# Patient Record
Sex: Female | Born: 1960 | Race: White | Hispanic: No | Marital: Married | Smoking: Never smoker
Health system: Southern US, Community
[De-identification: ages and names within clinical notes are randomized; demographics above are authoritative.]

## PROBLEM LIST (undated history)

## (undated) DIAGNOSIS — N200 Calculus of kidney: Secondary | ICD-10-CM

## (undated) HISTORY — PX: OTHER SURGICAL HISTORY: SHX169

---

## 1999-05-12 ENCOUNTER — Inpatient Hospital Stay (HOSPITAL_COMMUNITY): Admission: AD | Admit: 1999-05-12 | Discharge: 1999-05-12 | Payer: Self-pay | Admitting: *Deleted

## 1999-05-26 ENCOUNTER — Ambulatory Visit (HOSPITAL_COMMUNITY): Admission: EM | Admit: 1999-05-26 | Discharge: 1999-05-26 | Payer: Self-pay | Admitting: Emergency Medicine

## 1999-05-26 ENCOUNTER — Encounter: Payer: Self-pay | Admitting: Urology

## 2011-06-10 ENCOUNTER — Other Ambulatory Visit (HOSPITAL_COMMUNITY): Payer: Self-pay | Admitting: Internal Medicine

## 2011-06-10 DIAGNOSIS — R9431 Abnormal electrocardiogram [ECG] [EKG]: Secondary | ICD-10-CM

## 2011-06-11 ENCOUNTER — Ambulatory Visit (HOSPITAL_COMMUNITY): Payer: PRIVATE HEALTH INSURANCE | Attending: Internal Medicine | Admitting: Radiology

## 2011-06-11 DIAGNOSIS — I079 Rheumatic tricuspid valve disease, unspecified: Secondary | ICD-10-CM | POA: Insufficient documentation

## 2011-06-11 DIAGNOSIS — R9431 Abnormal electrocardiogram [ECG] [EKG]: Secondary | ICD-10-CM | POA: Insufficient documentation

## 2012-09-14 ENCOUNTER — Emergency Department (HOSPITAL_COMMUNITY)
Admission: EM | Admit: 2012-09-14 | Discharge: 2012-09-15 | Disposition: A | Discharge status: Home / Self Care | Payer: PRIVATE HEALTH INSURANCE | Attending: Emergency Medicine | Admitting: Emergency Medicine

## 2012-09-14 ENCOUNTER — Encounter (HOSPITAL_COMMUNITY): Payer: Self-pay | Admitting: Emergency Medicine

## 2012-09-14 DIAGNOSIS — N200 Calculus of kidney: Secondary | ICD-10-CM

## 2012-09-14 DIAGNOSIS — R109 Unspecified abdominal pain: Secondary | ICD-10-CM | POA: Insufficient documentation

## 2012-09-14 DIAGNOSIS — N201 Calculus of ureter: Secondary | ICD-10-CM | POA: Insufficient documentation

## 2012-09-14 HISTORY — DX: Calculus of kidney: N20.0

## 2012-09-14 MED ORDER — FENTANYL CITRATE 0.05 MG/ML IJ SOLN
50.0000 ug | Freq: Once | INTRAMUSCULAR | Status: AC
  Administered 2012-09-15: 50 ug via INTRAVENOUS
  Filled 2012-09-14: qty 2

## 2012-09-14 NOTE — ED Notes (Signed)
Pt c/o left flank pain that radiates to groin. Pain rating 9/10. Hx of kidney stones.Pt c/o N/V

## 2012-09-14 NOTE — ED Notes (Signed)
Pt went to br, bm with approx 2 in stemlike/stick noted in with BM

## 2012-09-15 ENCOUNTER — Emergency Department (HOSPITAL_COMMUNITY): Payer: PRIVATE HEALTH INSURANCE

## 2012-09-15 LAB — COMPREHENSIVE METABOLIC PANEL
ALT: 42 U/L — ABNORMAL HIGH (ref 0–35)
AST: 38 U/L — ABNORMAL HIGH (ref 0–37)
Alkaline Phosphatase: 61 U/L (ref 39–117)
CO2: 27 mEq/L (ref 19–32)
Calcium: 9.9 mg/dL (ref 8.4–10.5)
GFR calc Af Amer: 63 mL/min — ABNORMAL LOW (ref >90)
GFR calc non Af Amer: 55 mL/min — ABNORMAL LOW (ref >90)
Glucose, Bld: 114 mg/dL — ABNORMAL HIGH (ref 70–99)
Potassium: 3.5 mEq/L (ref 3.5–5.1)
Sodium: 136 mEq/L (ref 135–145)
Total Protein: 7.8 g/dL (ref 6.0–8.3)

## 2012-09-15 LAB — CBC WITH DIFFERENTIAL/PLATELET
Basophils Absolute: 0 10*3/uL (ref 0.0–0.1)
Lymphocytes Relative: 17 % (ref 12–46)
Lymphs Abs: 2.2 10*3/uL (ref 0.7–4.0)
Neutrophils Relative %: 73 % (ref 43–77)
Platelets: 256 10*3/uL (ref 150–400)
RBC: 4.46 MIL/uL (ref 3.87–5.11)
RDW: 13 % (ref 11.5–15.5)
WBC: 13 10*3/uL — ABNORMAL HIGH (ref 4.0–10.5)

## 2012-09-15 LAB — URINE MICROSCOPIC-ADD ON

## 2012-09-15 LAB — URINALYSIS, ROUTINE W REFLEX MICROSCOPIC
Bilirubin Urine: NEGATIVE
Nitrite: NEGATIVE
Specific Gravity, Urine: 1.025 (ref 1.005–1.030)
pH: 6 (ref 5.0–8.0)

## 2012-09-15 MED ORDER — ONDANSETRON HCL 4 MG/2ML IJ SOLN
INTRAMUSCULAR | Status: AC
  Filled 2012-09-15: qty 2

## 2012-09-15 MED ORDER — ONDANSETRON HCL 4 MG/2ML IJ SOLN
4.0000 mg | Freq: Once | INTRAMUSCULAR | Status: AC
  Administered 2012-09-15: via INTRAVENOUS

## 2012-09-15 MED ORDER — OXYCODONE-ACETAMINOPHEN 5-325 MG PO TABS
1.0000 | ORAL_TABLET | ORAL | Status: AC | PRN
Start: 2012-09-15 — End: ?

## 2012-09-15 MED ORDER — TAMSULOSIN HCL 0.4 MG PO CAPS: 0.4000 mg | ORAL_CAPSULE | Freq: Every day | ORAL | Status: AC

## 2012-09-15 MED ORDER — PROMETHAZINE HCL 25 MG PO TABS: 25.0000 mg | ORAL_TABLET | Freq: Four times a day (QID) | ORAL | Status: AC | PRN

## 2012-09-15 MED ORDER — HYDROMORPHONE HCL PF 1 MG/ML IJ SOLN
1.0000 mg | Freq: Once | INTRAMUSCULAR | Status: AC
  Administered 2012-09-15: 1 mg via INTRAVENOUS
  Filled 2012-09-15: qty 1

## 2012-09-15 NOTE — ED Provider Notes (Signed)
History     CSN: [id number]  Arrival date & time 09/14/12  2254   First MD Initiated Contact with Patient 09/15/12 0029      Chief Complaint  Patient presents with  . Flank Pain    (Consider location/radiation/quality/duration/timing/severity/associated sxs/prior treatment) Patient is a 51 y.o. female presenting with flank pain. The history is provided by the patient.  Flank Pain Pertinent negatives include no chest pain, no abdominal pain, no headaches and no shortness of breath.  L flnak started 8pm, severe, sharp and radiates from back to L flank, h/o kidney stone 10 years ago and feels similar, no F/C, nausea no vomiting. No truama, no hematuria, no dysuria.   Past Medical History  Diagnosis Date  . Kidney stones     Past Surgical History  Procedure Date  . Eptopic     No family history on file.  History  Substance Use Topics  . Smoking status: Never Smoker   . Smokeless tobacco: Not on file  . Alcohol Use: Yes    OB History    Grav Para Term Preterm Abortions TAB SAB Ect Mult Living                  Review of Systems  Constitutional: Negative for fever and chills.  HENT: Negative for neck pain and neck stiffness.   Eyes: Negative for pain.  Respiratory: Negative for shortness of breath.   Cardiovascular: Negative for chest pain.  Gastrointestinal: Negative for abdominal pain.  Genitourinary: Positive for flank pain. Negative for dysuria.  Musculoskeletal: Negative for back pain.  Skin: Negative for rash.  Neurological: Negative for headaches.  All other systems reviewed and are negative.    Allergies  Review of patient's allergies indicates no known allergies.  Home Medications   Current Outpatient Rx  Name Route Sig Dispense Refill  . VITAMIN D 1000 UNITS PO TABS Oral Take 1,000 Units by mouth daily.    Marland Kitchen LEVOTHYROXINE SODIUM 50 MCG PO TABS Oral Take 50 mcg by mouth every morning.    . ADULT MULTIVITAMIN W/MINERALS CH Oral Take 1 tablet by  mouth daily.    . OMEGA-3-ACID ETHYL ESTERS 1 G PO CAPS Oral Take 1 g by mouth daily.    Marland Kitchen ROSUVASTATIN CALCIUM 5 MG PO TABS Oral Take 5 mg by mouth every evening.      BP 96/53  Pulse 59  Temp 97.5 F (36.4 C) (Oral)  Resp 18  Ht 5\' 3"  (1.6 m)  Wt 110 lb (49.896 kg)  BMI 19.49 kg/m2  SpO2 100%  Physical Exam  Constitutional: She is oriented to person, place, and time. She appears well-developed and well-nourished.  HENT:  Head: Normocephalic and atraumatic.  Eyes: Conjunctivae normal and EOM are normal. Pupils are equal, round, and reactive to light.  Neck: Trachea normal. Neck supple. No thyromegaly present.  Cardiovascular: Normal rate, regular rhythm, S1 normal, S2 normal and normal pulses.     No systolic murmur is present   No diastolic murmur is present  Pulses:      Radial pulses are 2+ on the right side, and 2+ on the left side.  Pulmonary/Chest: Effort normal and breath sounds normal. She has no wheezes. She has no rhonchi. She has no rales. She exhibits no tenderness.  Abdominal: Soft. Normal appearance and bowel sounds are normal. There is no rebound, no guarding, no CVA tenderness and negative Murphy's sign.       TTP l flank no peritonitis or ABD  tenderness.   Musculoskeletal:       BLE:s Calves nontender, no cords or erythema, negative Homans sign  Neurological: She is alert and oriented to person, place, and time. She has normal strength. No cranial nerve deficit or sensory deficit. GCS eye subscore is 4. GCS verbal subscore is 5. GCS motor subscore is 6.  Skin: Skin is warm and dry. No rash noted. She is not diaphoretic.  Psychiatric: Her speech is normal.       Cooperative and appropriate    ED Course  Procedures (including critical care time)  IVFs.  IV fentanyl no relief of pain  IV Dilaudid.  On recheck is feeling much better.   Results for orders placed during the hospital encounter of 09/14/12  CBC WITH DIFFERENTIAL      Component Value Range    WBC 13.0 (*) 4.0 - 10.5 K/uL   RBC 4.46  3.87 - 5.11 MIL/uL   Hemoglobin 14.1  12.0 - 15.0 g/dL   HCT 16.1  09.6 - 04.5 %   MCV 90.6  78.0 - 100.0 fL   MCH 31.6  26.0 - 34.0 pg   MCHC 34.9  30.0 - 36.0 g/dL   RDW 40.9  81.1 - 91.4 %   Platelets 256  150 - 400 K/uL   Neutrophils Relative 73  43 - 77 %   Neutro Abs 9.5 (*) 1.7 - 7.7 K/uL   Lymphocytes Relative 17  12 - 46 %   Lymphs Abs 2.2  0.7 - 4.0 K/uL   Monocytes Relative 9  3 - 12 %   Monocytes Absolute 1.2 (*) 0.1 - 1.0 K/uL   Eosinophils Relative 1  0 - 5 %   Eosinophils Absolute 0.1  0.0 - 0.7 K/uL   Basophils Relative 0  0 - 1 %   Basophils Absolute 0.0  0.0 - 0.1 K/uL  COMPREHENSIVE METABOLIC PANEL      Component Value Range   Sodium 136  135 - 145 mEq/L   Potassium 3.5  3.5 - 5.1 mEq/L   Chloride 97  96 - 112 mEq/L   CO2 27  19 - 32 mEq/L   Glucose, Bld 114 (*) 70 - 99 mg/dL   BUN 19  6 - 23 mg/dL   Creatinine, Ser 7.82 (*) 0.50 - 1.10 mg/dL   Calcium 9.9  8.4 - 95.6 mg/dL   Total Protein 7.8  6.0 - 8.3 g/dL   Albumin 4.2  3.5 - 5.2 g/dL   AST 38 (*) 0 - 37 U/L   ALT 42 (*) 0 - 35 U/L   Alkaline Phosphatase 61  39 - 117 U/L   Total Bilirubin 0.3  0.3 - 1.2 mg/dL   GFR calc non Af Amer 55 (*) >90 mL/min   GFR calc Af Amer 63 (*) >90 mL/min  URINALYSIS, ROUTINE W REFLEX MICROSCOPIC      Component Value Range   Color, Urine YELLOW  YELLOW   APPearance CLOUDY (*) CLEAR   Specific Gravity, Urine 1.025  1.005 - 1.030   pH 6.0  5.0 - 8.0   Glucose, UA NEGATIVE  NEGATIVE mg/dL   Hgb urine dipstick SMALL (*) NEGATIVE   Bilirubin Urine NEGATIVE  NEGATIVE   Ketones, ur TRACE (*) NEGATIVE mg/dL   Protein, ur NEGATIVE  NEGATIVE mg/dL   Urobilinogen, UA 1.0  0.0 - 1.0 mg/dL   Nitrite NEGATIVE  NEGATIVE   Leukocytes, UA MODERATE (*) NEGATIVE  URINE MICROSCOPIC-ADD ON  Component Value Range   Squamous Epithelial / LPF FEW (*) RARE   WBC, UA 3-6  <3 WBC/hpf   RBC / HPF 3-6  <3 RBC/hpf   Bacteria, UA RARE  RARE    Crystals CA OXALATE CRYSTALS (*) NEGATIVE   Ct Abdomen Pelvis Wo Contrast  09/15/2012  *RADIOLOGY REPORT*  Clinical Data: Left flank pain.  Pain radiating to the groin.  CT ABDOMEN AND PELVIS WITHOUT CONTRAST  Technique:  Multidetector CT imaging of the abdomen and pelvis was performed following the standard protocol without intravenous contrast.  Comparison: None.  Findings: Lung Bases: Mild atelectasis.  Liver:  Unenhanced CT was performed per clinician order.  Lack of IV contrast limits sensitivity and specificity, especially for evaluation of abdominal/pelvic solid viscera.  Focal fatty infiltration adjacent to the falciform ligament of the liver. Otherwise grossly normal.  Spleen:  Normal.  Gallbladder:  Normal.  Common bile duct:  Normal.  Pancreas:  Normal.  Adrenal glands:  Normal.  Kidneys:  Moderate left hydronephrosis.  Left UPJ stone is present measuring 4 mm x 2 mm.  No residual renal calculi.  Distal left ureter appears normal.  Right kidney and ureter normal.  Stomach:  Grossly normal.  Small bowel:  Normal.  Colon:   Normal retrocecal appendix.  Prominent stool burden. Distal colon decompressed.  Pelvic Genitourinary:  Physiologic appearance of the uterus and adnexa.  Urinary bladder decompressed.  Bones:  Normal.  Vasculature: Normal.  IMPRESSION: Moderate left hydronephrosis with 4 mm x 3 mm left UPJ stone.  No residual collecting system calculi.   Original Report Authenticated By: Andreas Newport, M.D.    1:24 AM feels comfortable for d/c home. Kidney stone precautions verbalized as understood. RX percocet, zofran and flomax. Urology referral provided.    MDM   Ureterolithiasis L sided with moderate hydro by CT scan reviewed. Pain controlled IV narcotics. IVFs provided. UA as above - no UTI symptoms. Labs reviewed as above crt 1.14        Sunnie Nielsen, MD 09/15/12 0127

## 2012-11-25 ENCOUNTER — Encounter (INDEPENDENT_AMBULATORY_CARE_PROVIDER_SITE_OTHER): Payer: Self-pay | Admitting: Surgery

## 2012-11-25 ENCOUNTER — Ambulatory Visit (INDEPENDENT_AMBULATORY_CARE_PROVIDER_SITE_OTHER): Payer: PRIVATE HEALTH INSURANCE | Admitting: Surgery

## 2012-11-25 VITALS — BP 102/68 | HR 71 | Temp 98.6°F | Resp 18 | Ht 63.0 in | Wt 105.4 lb

## 2012-11-25 DIAGNOSIS — K649 Unspecified hemorrhoids: Secondary | ICD-10-CM | POA: Insufficient documentation

## 2012-11-25 NOTE — Progress Notes (Signed)
[facility]  Candice Kin, MD,  FACS [address] [place]  [address] [place], [place]    [address] Phone:  [phone] FAX:  [phone]   Re:   Candice Reyes DOB:   12/06/1961 MRN:   [id number]  Urgent Clinic  ASSESSMENT AND PLAN: 1.  Hemorrhoids  External, but these are not the source of her pain  1a.  Posterior anal fissure  I gave her information on rectal disease and rectal fissures.  I gave her a prescription for diltiazam.  I gave her literature on rectal disease.  Return appt in 4 weeks.  2.  History of left nephrolithiasis   Sees Dr. Retta Diones.  Seen in ER - 09/14/2012  3.  Hypothyroid x 10 years - corrected 4.  Hypercholesterolemia x 4 years  HISTORY OF PRESENT ILLNESS: Chief Complaint  Patient presents with  . Rectal Pain    Candice Reyes is a 51 y.o. (DOB: 06-01-1961)  white female who is a patient of PERINI,MARK A, MD and comes to me today for rectal pain and hemorrhoids.  She had a colonoscopy about one year ago by Dr. Evette Cristal.  She has no history of stomach, liver, pancreas, or colon disease.  She has hemorrhoids that have flared up in the past.  This episode started in the October and had gotten slightly worse since then.  She has pain pretty much all the time, a little worse with bowel movements.  She had some "loose stools" at the beginning of the pain, but her bowel habits have been very regular since then.  Past Medical History  Diagnosis Date  . Kidney stones    Current Outpatient Prescriptions  Medication Sig Dispense Refill  . cholecalciferol (VITAMIN D) 1000 UNITS tablet Take 1,000 Units by mouth daily.      Marland Kitchen levothyroxine (SYNTHROID, LEVOTHROID) 50 MCG tablet Take 50 mcg by mouth every morning.      . Multiple Vitamin (MULTIVITAMIN WITH MINERALS) TABS Take 1 tablet by mouth daily.      Marland Kitchen omega-3 acid ethyl esters (LOVAZA) 1 G capsule Take 1 g by mouth daily.      Marland Kitchen oxyCODONE-acetaminophen (PERCOCET/ROXICET) 5-325 MG per  tablet Take 1 tablet by mouth every 4 (four) hours as needed for pain.  15 tablet  0  . promethazine (PHENERGAN) 25 MG tablet Take 1 tablet (25 mg total) by mouth every 6 (six) hours as needed for nausea.  30 tablet  0  . rosuvastatin (CRESTOR) 5 MG tablet Take 5 mg by mouth every evening.      . Tamsulosin HCl (FLOMAX) 0.4 MG CAPS Take 1 capsule (0.4 mg total) by mouth daily after breakfast.  30 capsule  0   Social history: She works as a [employer] operational officer. Note:  I operated on her mother, Regan Lemming, for diverticular disease.  She is doing well.  PHYSICAL EXAM: There were no vitals taken for this visit.  Abdomen:  Benign Rectum:  External hemorrhoids, moderate.  Posterior anal pain c/w a fissure, though I could not see it.  She does have moderate anal spasm.  DATA REVIEWED: None new.  Candice Kin, MD, FACS Office:  [phone]

## 2012-12-01 ENCOUNTER — Ambulatory Visit (INDEPENDENT_AMBULATORY_CARE_PROVIDER_SITE_OTHER): Payer: PRIVATE HEALTH INSURANCE | Admitting: General Surgery

## 2012-12-24 ENCOUNTER — Encounter (INDEPENDENT_AMBULATORY_CARE_PROVIDER_SITE_OTHER): Payer: PRIVATE HEALTH INSURANCE | Admitting: Surgery

## 2013-11-28 ENCOUNTER — Other Ambulatory Visit: Payer: Self-pay | Admitting: Internal Medicine

## 2013-11-28 DIAGNOSIS — N200 Calculus of kidney: Secondary | ICD-10-CM

## 2013-11-29 ENCOUNTER — Ambulatory Visit
Admission: RE | Admit: 2013-11-29 | Discharge: 2013-11-29 | Disposition: A | Discharge status: Home / Self Care | Payer: PRIVATE HEALTH INSURANCE | Source: Ambulatory Visit | Attending: Internal Medicine | Admitting: Internal Medicine

## 2013-11-29 ENCOUNTER — Other Ambulatory Visit: Payer: Self-pay | Admitting: Internal Medicine

## 2013-11-29 DIAGNOSIS — R109 Unspecified abdominal pain: Secondary | ICD-10-CM

## 2013-11-29 DIAGNOSIS — N200 Calculus of kidney: Secondary | ICD-10-CM

## 2014-04-11 ENCOUNTER — Other Ambulatory Visit: Payer: Self-pay | Admitting: Internal Medicine

## 2014-04-11 DIAGNOSIS — R109 Unspecified abdominal pain: Secondary | ICD-10-CM

## 2014-04-12 ENCOUNTER — Ambulatory Visit
Admission: RE | Admit: 2014-04-12 | Discharge: 2014-04-12 | Disposition: A | Discharge status: Home / Self Care | Payer: PRIVATE HEALTH INSURANCE | Source: Ambulatory Visit | Attending: Internal Medicine | Admitting: Internal Medicine

## 2014-04-12 ENCOUNTER — Other Ambulatory Visit: Payer: Self-pay | Admitting: Internal Medicine

## 2014-04-12 DIAGNOSIS — R109 Unspecified abdominal pain: Secondary | ICD-10-CM

## 2014-04-12 DIAGNOSIS — N200 Calculus of kidney: Secondary | ICD-10-CM

## 2014-04-12 DIAGNOSIS — R1012 Left upper quadrant pain: Secondary | ICD-10-CM

## 2020-02-27 ENCOUNTER — Ambulatory Visit: Payer: PRIVATE HEALTH INSURANCE | Attending: Internal Medicine

## 2020-02-27 DIAGNOSIS — Z23 Encounter for immunization: Secondary | ICD-10-CM | POA: Insufficient documentation

## 2020-02-27 NOTE — Progress Notes (Signed)
   Covid-19 Vaccination Clinic  Name:  [name]    MRN: [id number] DOB: 04/11/1961  02/27/2020  [name] was observed post Covid-19 immunization for 15 minutes without incident. She was provided with Vaccine Information Sheet and instruction to access the V-Safe system.   [name] was instructed to call 911 with any severe reactions post vaccine: Marland Kitchen Difficulty breathing  . Swelling of face and throat  . A fast heartbeat  . A bad rash all over body  . Dizziness and weakness   Immunizations Administered    Name Date Dose VIS Date Route   Pfizer COVID-19 Vaccine 02/27/2020  2:13 PM 0.3 mL 12/02/2019 Intramuscular   Manufacturer: [employer], [employer]   Lot: [id number]   NDC: [address]-4

## 2020-03-28 ENCOUNTER — Ambulatory Visit: Payer: PRIVATE HEALTH INSURANCE | Attending: Internal Medicine

## 2020-03-28 DIAGNOSIS — Z23 Encounter for immunization: Secondary | ICD-10-CM

## 2020-03-28 NOTE — Progress Notes (Signed)
   Covid-19 Vaccination Clinic  Name:  JACLYNN LAUMANN    MRN: [id number] DOB: 02-04-1961  03/28/2020  Ms. Olmeda was observed post Covid-19 immunization for 15 minutes without incident. She was provided with Vaccine Information Sheet and instruction to access the V-Safe system.   Ms. Costanza was instructed to call 911 with any severe reactions post vaccine: Marland Kitchen Difficulty breathing  . Swelling of face and throat  . A fast heartbeat  . A bad rash all over body  . Dizziness and weakness   Immunizations Administered    Name Date Dose VIS Date Route   Pfizer COVID-19 Vaccine 03/28/2020 12:36 PM 0.3 mL 12/02/2019 Intramuscular   Manufacturer: [employer], [employer]   Lot: [id number]   NDC: [address]-6

## 2020-04-19 ENCOUNTER — Other Ambulatory Visit: Payer: Self-pay | Admitting: Internal Medicine

## 2020-04-19 DIAGNOSIS — R1011 Right upper quadrant pain: Secondary | ICD-10-CM

## 2020-04-27 ENCOUNTER — Ambulatory Visit
Admission: RE | Admit: 2020-04-27 | Discharge: 2020-04-27 | Disposition: A | Discharge status: Home / Self Care | Payer: PRIVATE HEALTH INSURANCE | Source: Ambulatory Visit | Attending: Internal Medicine | Admitting: Internal Medicine

## 2020-04-27 DIAGNOSIS — R1011 Right upper quadrant pain: Secondary | ICD-10-CM

## 2020-11-01 ENCOUNTER — Other Ambulatory Visit: Payer: Self-pay | Admitting: Internal Medicine

## 2020-11-01 DIAGNOSIS — M810 Age-related osteoporosis without current pathological fracture: Secondary | ICD-10-CM

## 2021-02-21 ENCOUNTER — Other Ambulatory Visit: Payer: No Typology Code available for payment source

## 2021-11-08 ENCOUNTER — Other Ambulatory Visit: Payer: Self-pay | Admitting: Internal Medicine

## 2021-11-08 DIAGNOSIS — E785 Hyperlipidemia, unspecified: Secondary | ICD-10-CM

## 2021-12-04 ENCOUNTER — Ambulatory Visit
Admission: RE | Admit: 2021-12-04 | Discharge: 2021-12-04 | Disposition: A | Discharge status: Home / Self Care | Payer: No Typology Code available for payment source | Source: Ambulatory Visit | Attending: Internal Medicine | Admitting: Internal Medicine

## 2021-12-04 DIAGNOSIS — E785 Hyperlipidemia, unspecified: Secondary | ICD-10-CM

## 2022-01-28 IMAGING — CT CT CARDIAC CORONARY ARTERY CALCIUM SCORE
3 series · 14 of 20 positions shown, 16 images · non-contrast
Comparison: None.

CLINICAL DATA: 60-year-old Caucasian female with family history of
heart disease.

EXAM:
CT CARDIAC CORONARY ARTERY CALCIUM SCORE
TECHNIQUE: Non-contrast imaging through the heart was performed using
prospective ECG gating. Image post processing was performed on an
independent workstation, allowing for quantitative analysis of the
heart and coronary arteries. Note that this exam targets the heart
and the chest was not imaged in its entirety.

[Series 2: calcium scoring 2.00 qr36 bestdiast 70% hrt calciu · axial · 0.33mm/px · z∈[+1453,+1549]mm · 4 of 80 slices shown]
[im 16/80  vessel]
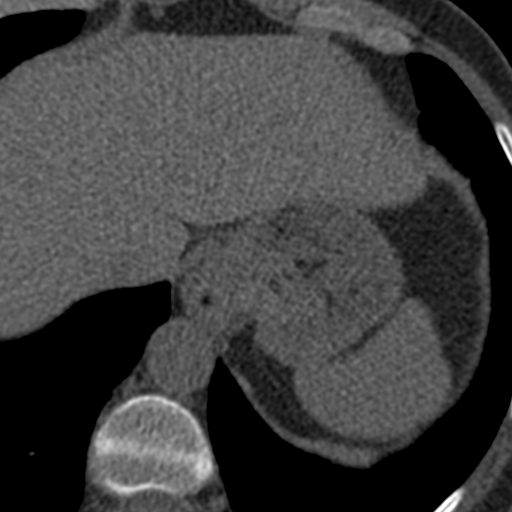
[im 32/80  vessel]
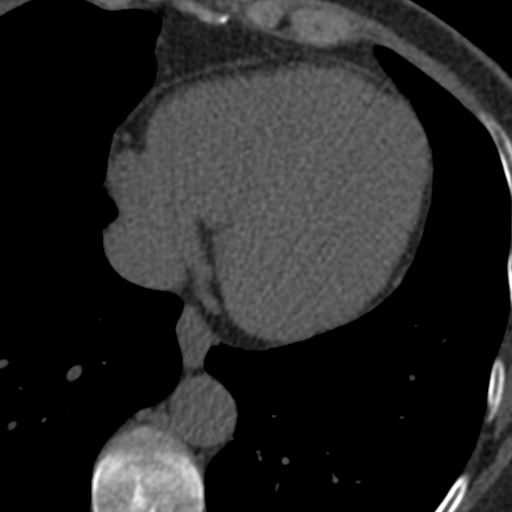
[im 48/80  vessel]
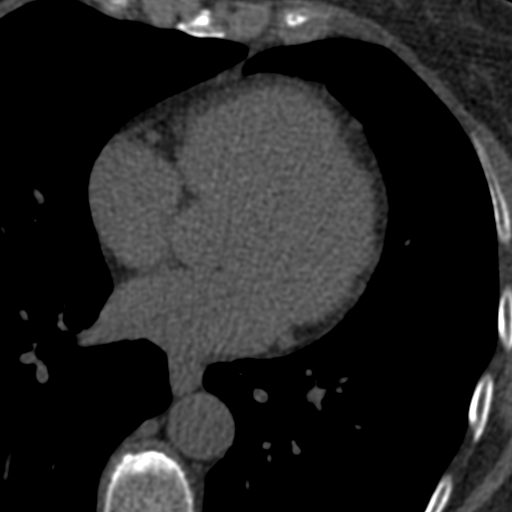
[im 64/80  vessel]
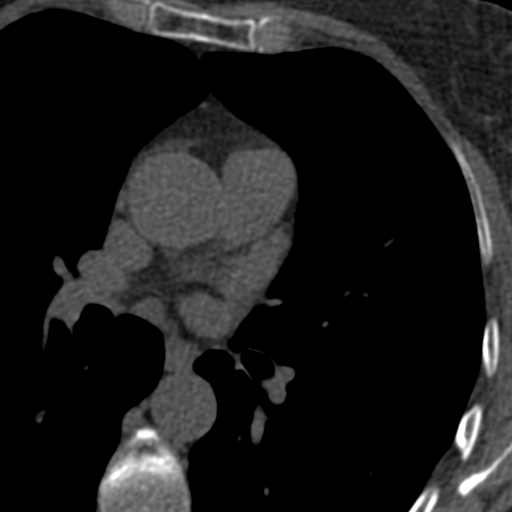

[Series 3: calcium scoring 2.00 br40 bestdiast 70% axial · axial · 0.56mm/px · z∈[+1449,+1553]mm · 5 of 80 slices shown, 7 images]
[im 14/80  vessel]
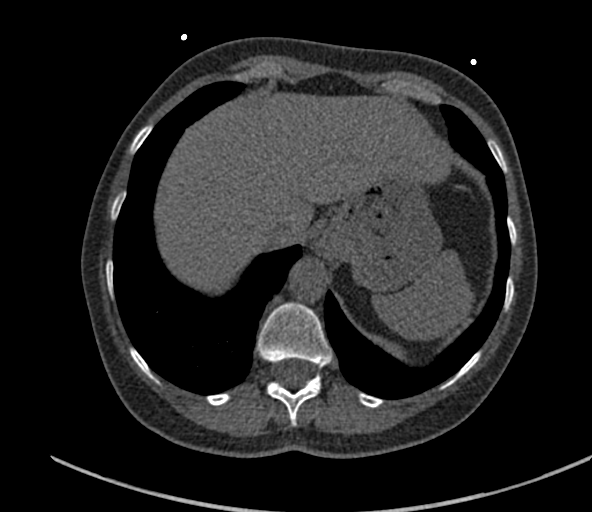
[im 14/80  lung]
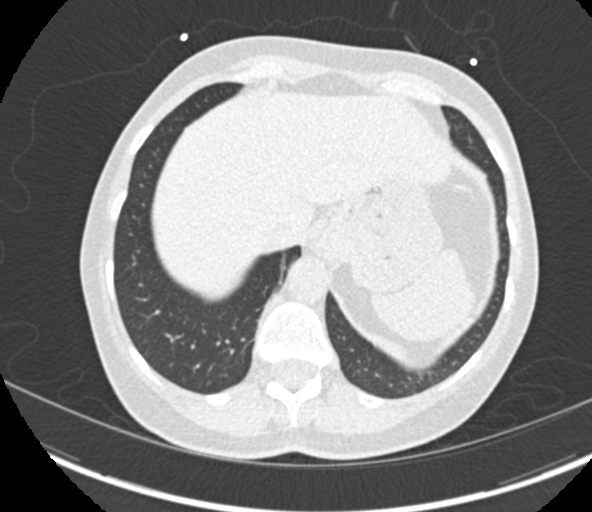
[im 27/80  vessel]
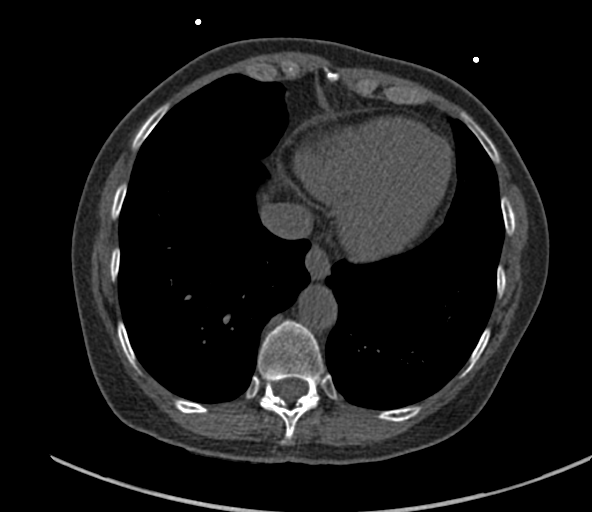
[im 40/80  vessel]
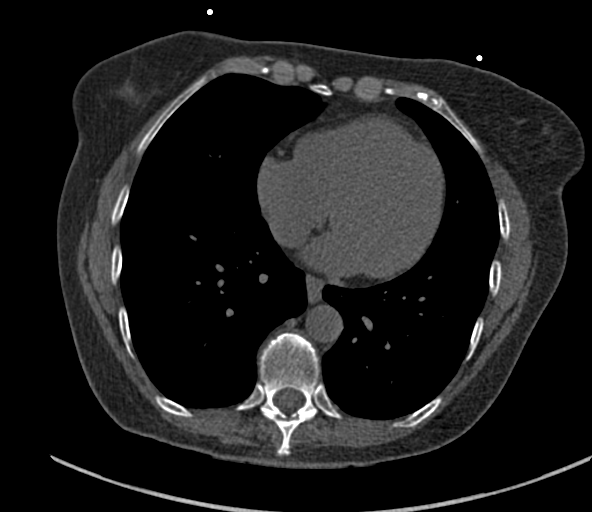
[im 53/80  vessel]
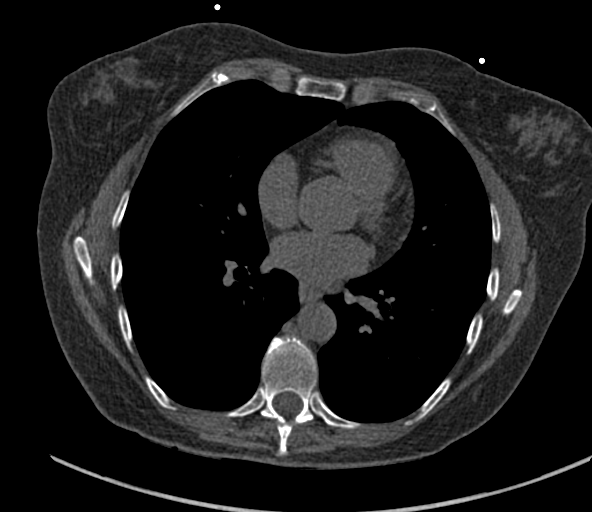
[im 66/80  vessel]
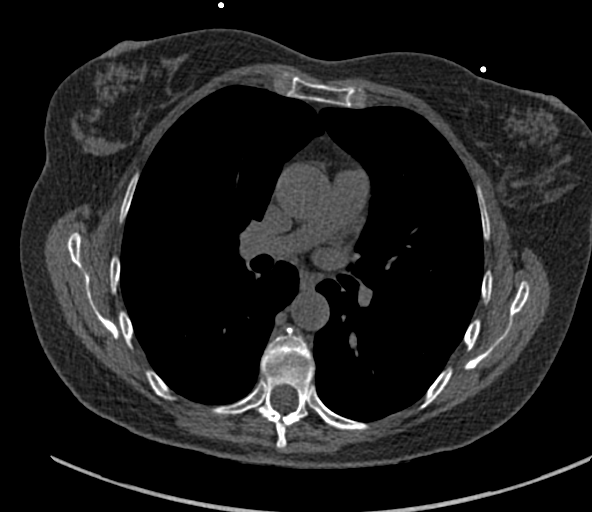
[im 66/80  lung]
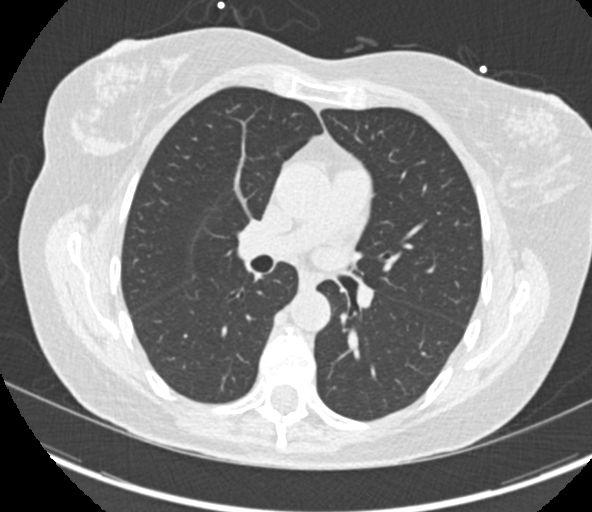

[Series 9: calcium scoring 2.00 br60 bestdiast 70% lungs · axial · 0.56mm/px · z∈[+1449,+1553]mm · 5 of 80 slices shown]
[im 14/80  vessel]
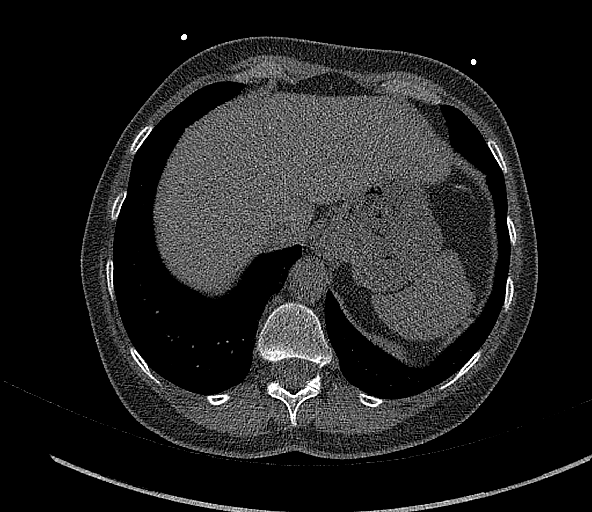
[im 27/80  vessel]
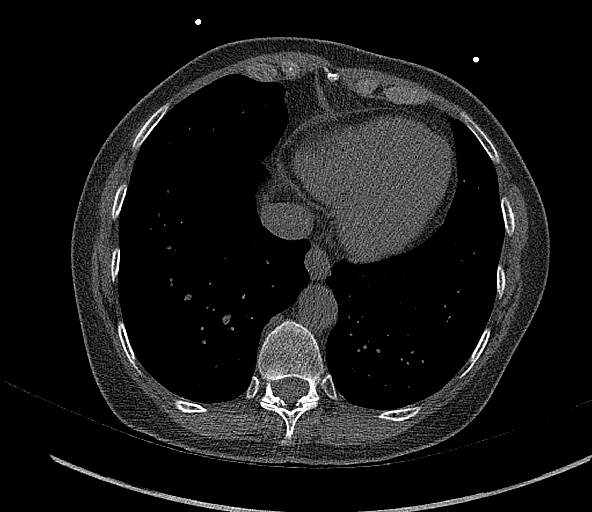
[im 40/80  vessel]
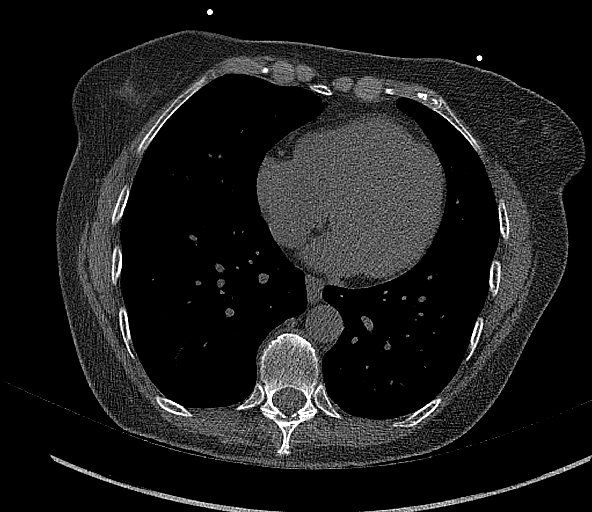
[im 53/80  vessel]
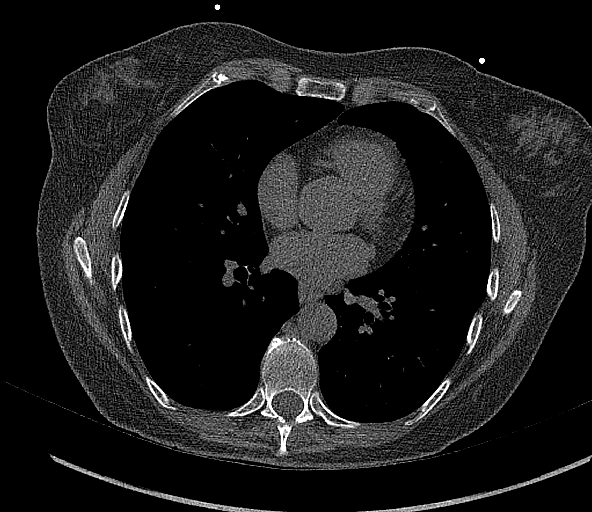
[im 66/80  vessel]
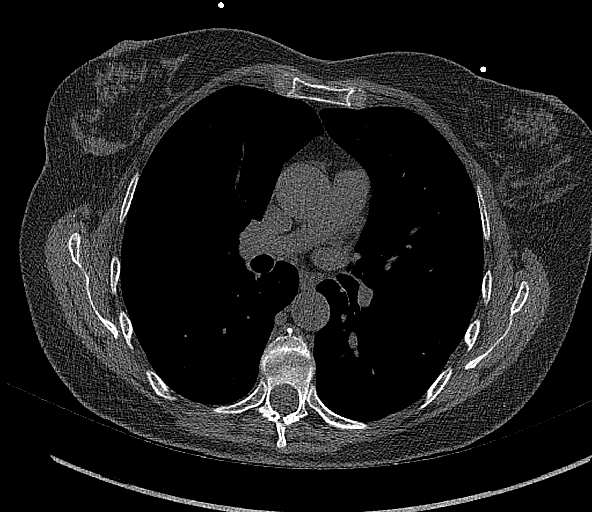

[14 of 20 positions shown; findings below may reference images not displayed]

FINDINGS: CORONARY CALCIUM SCORES:

Left Main: 0

LAD:

LCx: 0

RCA: 0

Total Agatston Score:

[HOSPITAL] percentile: 76

AORTA MEASUREMENTS:

Ascending Aorta: 30 mm

Descending Aorta: 21 mm

OTHER FINDINGS:

The heart size is within normal limits. No pericardial fluid is
identified. Visualized segments of the thoracic aorta and central
pulmonary arteries are normal in caliber. Visualized mediastinum and
hilar regions demonstrate no lymphadenopathy or masses. There is a
small hiatal hernia. Visualized lungs show no evidence of pulmonary
edema, consolidation, pneumothorax, nodule or pleural fluid.
Visualized upper abdomen and bony structures are unremarkable.
IMPRESSION: 1. Coronary calcium score 20.5 is at the 76th percentile for the
patient's age, sex and race.
2. Small hiatal hernia.

## 2023-04-27 ENCOUNTER — Other Ambulatory Visit: Payer: Self-pay | Admitting: Nurse Practitioner

## 2023-04-27 DIAGNOSIS — R748 Abnormal levels of other serum enzymes: Secondary | ICD-10-CM

## 2023-05-28 ENCOUNTER — Ambulatory Visit
Admission: RE | Admit: 2023-05-28 | Discharge: 2023-05-28 | Disposition: A | Discharge status: Home / Self Care | Payer: 59 | Source: Ambulatory Visit | Attending: Nurse Practitioner | Admitting: Nurse Practitioner

## 2023-05-28 DIAGNOSIS — R748 Abnormal levels of other serum enzymes: Secondary | ICD-10-CM

## 2024-06-10 ENCOUNTER — Telehealth: Payer: Self-pay | Admitting: Pharmacy Technician

## 2024-06-10 NOTE — Telephone Encounter (Signed)
 Auth Submission: APPROVED Site of care: Site of care: [facility] Payer: [employer] MEDICARE Medication & CPT/J Code(s) submitted: Prolia (Denosumab) [id number] Diagnosis Code:  Route of submission (phone, fax, portal):  Phone # Fax # Auth type: Buy/Bill HB Units/visits requested: 60MG  Q6 MONTHS X 2 DOSES Reference number: [id number] Approval from: 06/10/24 to 06/10/25

## 2024-07-12 ENCOUNTER — Other Ambulatory Visit (HOSPITAL_COMMUNITY): Payer: Self-pay | Admitting: *Deleted

## 2024-07-13 ENCOUNTER — Ambulatory Visit (HOSPITAL_COMMUNITY)
Admission: RE | Admit: 2024-07-13 | Discharge: 2024-07-13 | Disposition: A | Discharge status: Home / Self Care | Source: Ambulatory Visit | Attending: Internal Medicine | Admitting: Internal Medicine

## 2024-07-13 DIAGNOSIS — M810 Age-related osteoporosis without current pathological fracture: Secondary | ICD-10-CM | POA: Insufficient documentation

## 2024-07-13 MED ORDER — DENOSUMAB 60 MG/ML ~~LOC~~ SOSY
60.0000 mg | PREFILLED_SYRINGE | Freq: Once | SUBCUTANEOUS | Status: AC
  Administered 2024-07-13: 60 mg via SUBCUTANEOUS

## 2024-07-13 MED ORDER — DENOSUMAB 60 MG/ML ~~LOC~~ SOSY
PREFILLED_SYRINGE | SUBCUTANEOUS | Status: AC
Start: 2024-07-13 — End: 2024-07-13
  Filled 2024-07-13: qty 1

## 2024-08-23 ENCOUNTER — Other Ambulatory Visit: Payer: Self-pay

## 2024-12-29 ENCOUNTER — Other Ambulatory Visit (HOSPITAL_COMMUNITY): Payer: Self-pay | Admitting: Internal Medicine

## 2024-12-29 DIAGNOSIS — M810 Age-related osteoporosis without current pathological fracture: Secondary | ICD-10-CM | POA: Insufficient documentation

## 2024-12-30 ENCOUNTER — Encounter (HOSPITAL_COMMUNITY): Payer: Self-pay | Admitting: Internal Medicine

## 2024-12-30 ENCOUNTER — Telehealth (HOSPITAL_COMMUNITY): Payer: Self-pay | Admitting: Pharmacy Technician

## 2024-12-30 NOTE — Telephone Encounter (Signed)
 Auth Submission: NO AUTH NEEDED Site of care: [facility] Payer: [employer] COMMERCIAL Medication & CPT/J Code(s) submitted: Stuboclo (denosumab -bmwo) [id number] Diagnosis Code: M81.0 Route of submission (phone, fax, portal): CHECKED [facility] PORTAL Phone # Fax # Auth type: Buy/Bill HB Units/visits requested: 60mg  x 2 doses, q 6 months Reference number: [id number]  Approval from: 12/30/2024 to 03/21/25 (eligibility ends per portal. Will need to reverify before next dose.)    Dagoberto Armour, CPhT [facility] Phone: [phone] 12/30/2024

## 2024-12-30 NOTE — Addendum Note (Signed)
 Addended by: DAYNE SHERRY RAMAN on: 12/30/2024 01:25 PM   Modules accepted: Orders

## 2025-01-17 ENCOUNTER — Encounter (HOSPITAL_COMMUNITY)

## 2025-01-20 ENCOUNTER — Encounter (HOSPITAL_COMMUNITY): Payer: Self-pay | Admitting: Internal Medicine

## 2025-01-24 ENCOUNTER — Inpatient Hospital Stay (HOSPITAL_COMMUNITY)
Admission: RE | Admit: 2025-01-24 | Discharge: 2025-01-24 | Disposition: A | Source: Ambulatory Visit | Attending: Internal Medicine

## 2025-01-24 VITALS — BP 123/73 | HR 74 | Temp 97.1°F | Resp 15

## 2025-01-24 DIAGNOSIS — M810 Age-related osteoporosis without current pathological fracture: Secondary | ICD-10-CM

## 2025-01-24 MED ORDER — DENOSUMAB-BMWO 60 MG/ML ~~LOC~~ SOSY
PREFILLED_SYRINGE | SUBCUTANEOUS | Status: AC
  Filled 2025-01-24: qty 1

## 2025-01-24 MED ORDER — DENOSUMAB-BMWO 60 MG/ML ~~LOC~~ SOSY
60.0000 mg | PREFILLED_SYRINGE | Freq: Once | SUBCUTANEOUS | Status: AC
  Administered 2025-01-24: 60 mg via SUBCUTANEOUS

## 2025-07-18 ENCOUNTER — Encounter (HOSPITAL_COMMUNITY)

## 2025-07-25 ENCOUNTER — Encounter (HOSPITAL_COMMUNITY)
